# Patient Record
Sex: Female | Born: 1945 | Race: White | Hispanic: No | Marital: Married | Smoking: Never smoker
Health system: Southern US, Community
[De-identification: ages and names within clinical notes are randomized; demographics above are authoritative.]

## PROBLEM LIST (undated history)

## (undated) DIAGNOSIS — I10 Essential (primary) hypertension: Secondary | ICD-10-CM

## (undated) HISTORY — PX: RECTOCELE REPAIR: SHX761

## (undated) HISTORY — PX: EYE SURGERY: SHX253

## (undated) HISTORY — PX: TONSILLECTOMY: SUR1361

## (undated) HISTORY — PX: CHOLECYSTECTOMY: SHX55

---

## 2007-12-23 ENCOUNTER — Ambulatory Visit: Payer: Self-pay | Admitting: Internal Medicine

## 2010-11-28 ENCOUNTER — Ambulatory Visit: Payer: Self-pay | Admitting: Internal Medicine

## 2011-12-06 DIAGNOSIS — H9319 Tinnitus, unspecified ear: Secondary | ICD-10-CM | POA: Insufficient documentation

## 2011-12-06 DIAGNOSIS — I10 Essential (primary) hypertension: Secondary | ICD-10-CM | POA: Insufficient documentation

## 2012-01-17 DIAGNOSIS — M542 Cervicalgia: Secondary | ICD-10-CM | POA: Insufficient documentation

## 2012-01-17 DIAGNOSIS — G5700 Lesion of sciatic nerve, unspecified lower limb: Secondary | ICD-10-CM | POA: Insufficient documentation

## 2015-09-30 DIAGNOSIS — R202 Paresthesia of skin: Secondary | ICD-10-CM | POA: Insufficient documentation

## 2016-04-14 DIAGNOSIS — M5010 Cervical disc disorder with radiculopathy, unspecified cervical region: Secondary | ICD-10-CM | POA: Insufficient documentation

## 2017-08-11 ENCOUNTER — Ambulatory Visit (INDEPENDENT_AMBULATORY_CARE_PROVIDER_SITE_OTHER): Payer: 59

## 2017-08-11 ENCOUNTER — Ambulatory Visit (INDEPENDENT_AMBULATORY_CARE_PROVIDER_SITE_OTHER): Payer: 59 | Admitting: Podiatry

## 2017-08-11 ENCOUNTER — Other Ambulatory Visit: Payer: Self-pay | Admitting: Podiatry

## 2017-08-11 DIAGNOSIS — M545 Low back pain: Secondary | ICD-10-CM

## 2017-08-11 DIAGNOSIS — M21619 Bunion of unspecified foot: Secondary | ICD-10-CM

## 2017-08-11 DIAGNOSIS — I471 Supraventricular tachycardia: Secondary | ICD-10-CM | POA: Insufficient documentation

## 2017-08-11 DIAGNOSIS — M8580 Other specified disorders of bone density and structure, unspecified site: Secondary | ICD-10-CM | POA: Insufficient documentation

## 2017-08-11 DIAGNOSIS — G8929 Other chronic pain: Secondary | ICD-10-CM | POA: Insufficient documentation

## 2017-08-11 DIAGNOSIS — E559 Vitamin D deficiency, unspecified: Secondary | ICD-10-CM | POA: Insufficient documentation

## 2017-08-11 DIAGNOSIS — G629 Polyneuropathy, unspecified: Secondary | ICD-10-CM | POA: Insufficient documentation

## 2017-08-11 DIAGNOSIS — E785 Hyperlipidemia, unspecified: Secondary | ICD-10-CM | POA: Insufficient documentation

## 2017-08-11 DIAGNOSIS — N281 Cyst of kidney, acquired: Secondary | ICD-10-CM | POA: Insufficient documentation

## 2017-08-11 DIAGNOSIS — M722 Plantar fascial fibromatosis: Secondary | ICD-10-CM

## 2017-08-11 DIAGNOSIS — E669 Obesity, unspecified: Secondary | ICD-10-CM | POA: Insufficient documentation

## 2017-08-14 NOTE — Progress Notes (Signed)
   Subjective: 72 year old female presenting today as a new patient with a chief complaint of a bunion to the medial side of the right foot. She states her great toe is beginning to shift underneath the 2nd toe. She reports having a left bunionectomy by Dr. Charlsie Merlesegal 10 years ago. Patient is here for further evaluation and treatment.   No past medical history on file.    Objective: Physical Exam General: The patient is alert and oriented x3 in no acute distress.  Dermatology: Skin is cool, dry and supple bilateral lower extremities. Negative for open lesions or macerations.  Vascular: Palpable pedal pulses bilaterally. No edema or erythema noted. Capillary refill within normal limits.  Neurological: Epicritic and protective threshold grossly intact bilaterally.   Musculoskeletal Exam: Clinical evidence of bunion deformity noted to the respective foot. There is a moderate pain on palpation range of motion of the first MPJ. Lateral deviation of the hallux noted consistent with hallux abductovalgus.  Radiographic Exam: Increased intermetatarsal angle greater than 15 with a hallux abductus angle greater than 30 noted on AP view. Moderate degenerative changes noted within the first MPJ.  Assessment: 1. HAV w/ bunion deformity right foot   Plan of Care:  1. Patient was evaluated. X-Rays reviewed. 2. Today we discussed the conservative versus surgical management of the presenting pathology. The patient opts for surgical management. All possible complications and details of the procedure were explained. All patient questions were answered. No guarantees were expressed or implied. 3. Authorization for surgery was initiated today. Surgery will consist of bunionectomy with osteotomy right. 4. CAM boot dispensed. 5. Return to clinic one week postop.     Felecia ShellingBrent M. Evans, DPM [facility]  Dr. Felecia ShellingBrent M. Evans, DPM    [address]                                          [place], [place] [address]                Office [phone]  Fax [phone]

## 2017-08-23 ENCOUNTER — Telehealth: Payer: Self-pay | Admitting: Podiatry

## 2017-08-23 ENCOUNTER — Other Ambulatory Visit: Payer: Self-pay | Admitting: Podiatry

## 2017-08-23 ENCOUNTER — Telehealth: Payer: Self-pay | Admitting: *Deleted

## 2017-08-23 MED ORDER — OXYCODONE-ACETAMINOPHEN 10-325 MG PO TABS: 1.0000 | ORAL_TABLET | Freq: Three times a day (TID) | ORAL | 0 refills | Status: DC | PRN

## 2017-08-23 MED ORDER — ONDANSETRON HCL 4 MG PO TABS: 4.0000 mg | ORAL_TABLET | Freq: Three times a day (TID) | ORAL | 0 refills | Status: DC | PRN

## 2017-08-23 NOTE — Telephone Encounter (Signed)
Called pt to let her know that we got authorization from [employer] for her insurance tomorrow. Dr. Pernell DupreAdams wanted to know if there was a reference or authorization number to which I gave her. Pt then stated she had some questions for the nurse. I transferred her to speak with our nurse Joya SanValery.

## 2017-08-23 NOTE — Telephone Encounter (Signed)
Pt states she has post op nausea and vomiting and requires 4mg  Zofran IV. Pt states her surgery is after 1:00pm tomorrow and she will have been without food for over 17 hours, if she eats at 8:00pm and goes to bed at 9:00pm and she feels that is too long to fast and would like to be able to eat or have clear liquid 8 hours before surgery. Pt states she would like to have the [facility] medication [facility] today.

## 2017-08-23 NOTE — Telephone Encounter (Signed)
Hello, this is Dr. Reginold AgentLaurel Perrott calling. I'm scheduled for surgery tomorrow with Dr. Logan BoresEvans. I'm calling to confirm that my insurance [employer] has provided the pre-approval. If I do not receive this pre-approval or I do not hear back from you I will have to cancel my surgery for tomorrow. So please call me back at your earliest convenience at [phone]. Thank you.

## 2017-08-23 NOTE — Progress Notes (Signed)
Patient scheduled for bunionectomy surgery tomorrow 08/24/2017.  Patient called in today stating that she does experience post surgical nausea.  She requests a prescription for Zofran 4 mg as needed nausea and vomiting postoperatively.  Patient also requests that she receives IV Zofran 4 mg postop.  Patient also requesting a prescription for pain medication sent to the pharmacy today for surgery tomorrow. Prescription for Zofran 4 mg #30 as well as Percocet 10/325 mg #30 sent to patient's pharmacy today.  Felecia ShellingBrent M. Khiya Friese, DPM [facility]  Dr. Felecia ShellingBrent M. Melitta Tigue, DPM    [address]                                    [place], [place] [address]                Office [phone]  Fax [phone]

## 2017-08-23 NOTE — Telephone Encounter (Signed)
Prescriptions sent to pharmacy

## 2017-08-23 NOTE — Telephone Encounter (Signed)
I informed pt, that Dr. Logan BoresEvans stated he would send the pain [id number], I had spoken with Judie GrieveBryan, RN - [facility] and he would discuss the IV Zofran with Anesthesiology, and the latest she could eat was 6:00am light dry toast, clear liquid without dairy or grease. Pt states understanding.

## 2017-08-23 NOTE — Telephone Encounter (Signed)
Left message for Katie - [facility] Pre-op Nurse to call with information concerning the NPO latest for 1:00pm pt, and IV Zofran.

## 2017-08-23 NOTE — Telephone Encounter (Signed)
I informed Judie GrieveBryan, RN - [facility] Surgical Procedure Head Nurse of pt's requirement for Zofran prior to procedure and request for lastest she could eat prior to surgery. Judie GrieveBryan, RN - states he will speak with anesthesiology concerning the IV Zofran, pt can have light food like dry toast and clear liquid, no dairy or grease prior to 6:00am 08/24/2017.

## 2017-09-01 ENCOUNTER — Encounter: Payer: 59 | Admitting: Podiatry

## 2017-09-05 NOTE — Progress Notes (Signed)
This encounter was created in error - please disregard.

## 2017-09-08 ENCOUNTER — Encounter: Payer: 59 | Admitting: Podiatry

## 2017-09-12 NOTE — Progress Notes (Signed)
This encounter was created in error - please disregard.

## 2017-10-02 NOTE — Progress Notes (Signed)
DOS:08-24-2017 Bunionectomy with osteotomy right foot

## 2021-03-10 ENCOUNTER — Other Ambulatory Visit: Payer: Self-pay | Admitting: Family Medicine

## 2021-03-10 DIAGNOSIS — Z1231 Encounter for screening mammogram for malignant neoplasm of breast: Secondary | ICD-10-CM

## 2021-03-10 DIAGNOSIS — Z780 Asymptomatic menopausal state: Secondary | ICD-10-CM

## 2021-04-06 ENCOUNTER — Other Ambulatory Visit: Payer: Self-pay

## 2021-04-06 ENCOUNTER — Ambulatory Visit
Admission: RE | Admit: 2021-04-06 | Discharge: 2021-04-06 | Disposition: A | Discharge status: Home / Self Care | Payer: Medicare Other | Source: Ambulatory Visit | Attending: Family Medicine | Admitting: Family Medicine

## 2021-04-06 DIAGNOSIS — Z780 Asymptomatic menopausal state: Secondary | ICD-10-CM | POA: Diagnosis not present

## 2021-04-06 DIAGNOSIS — Z1231 Encounter for screening mammogram for malignant neoplasm of breast: Secondary | ICD-10-CM

## 2021-04-06 DIAGNOSIS — M85852 Other specified disorders of bone density and structure, left thigh: Secondary | ICD-10-CM | POA: Insufficient documentation

## 2021-04-06 DIAGNOSIS — Z13820 Encounter for screening for osteoporosis: Secondary | ICD-10-CM | POA: Diagnosis present

## 2021-05-01 LAB — EXTERNAL GENERIC LAB PROCEDURE: COLOGUARD: NEGATIVE

## 2021-05-01 LAB — COLOGUARD: COLOGUARD: NEGATIVE

## 2022-03-28 IMAGING — MG MM DIGITAL SCREENING BILAT W/ TOMO AND CAD
6 of 10 series · 6 of 30 positions shown · non-contrast
Comparison: None.

CLINICAL DATA: Screening.

EXAM:
DIGITAL SCREENING BILATERAL MAMMOGRAM WITH TOMOSYNTHESIS AND CAD
TECHNIQUE: Bilateral screening digital craniocaudal and mediolateral oblique
mammograms were obtained. Bilateral screening digital breast
tomosynthesis was performed. The images were evaluated with
computer-aided detection.

[R MLO synth-2D (1 of 2)]
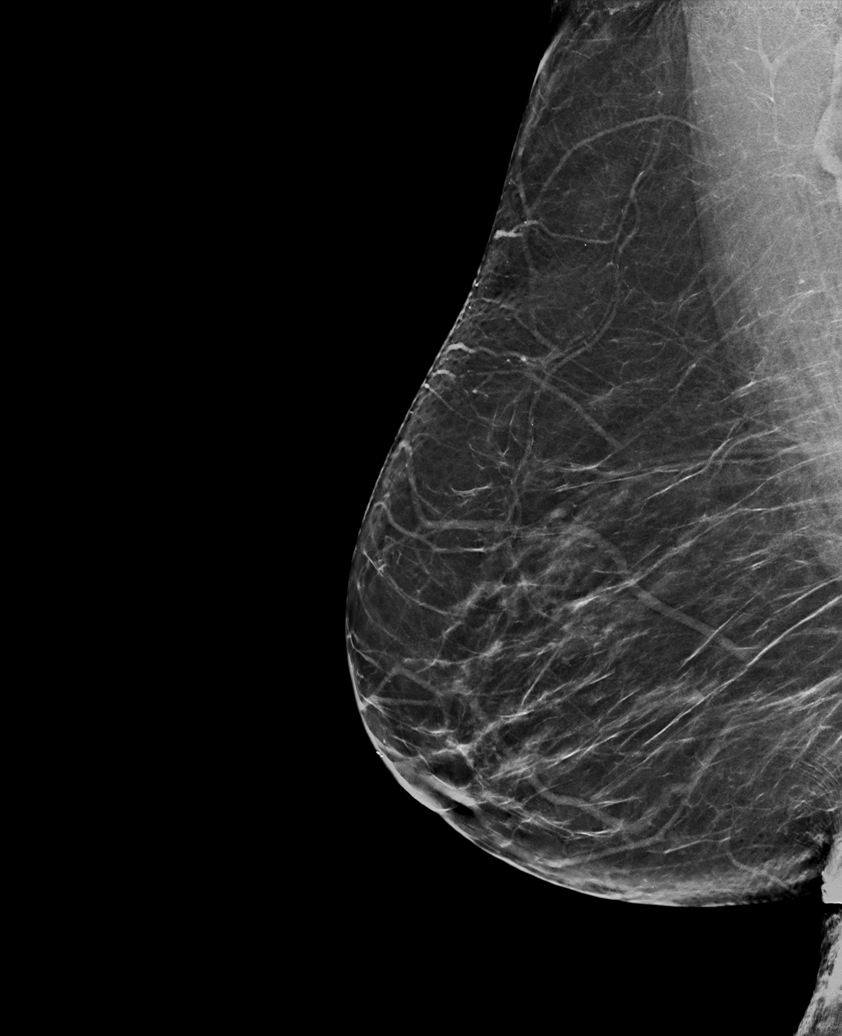

[R CC synth-2D]
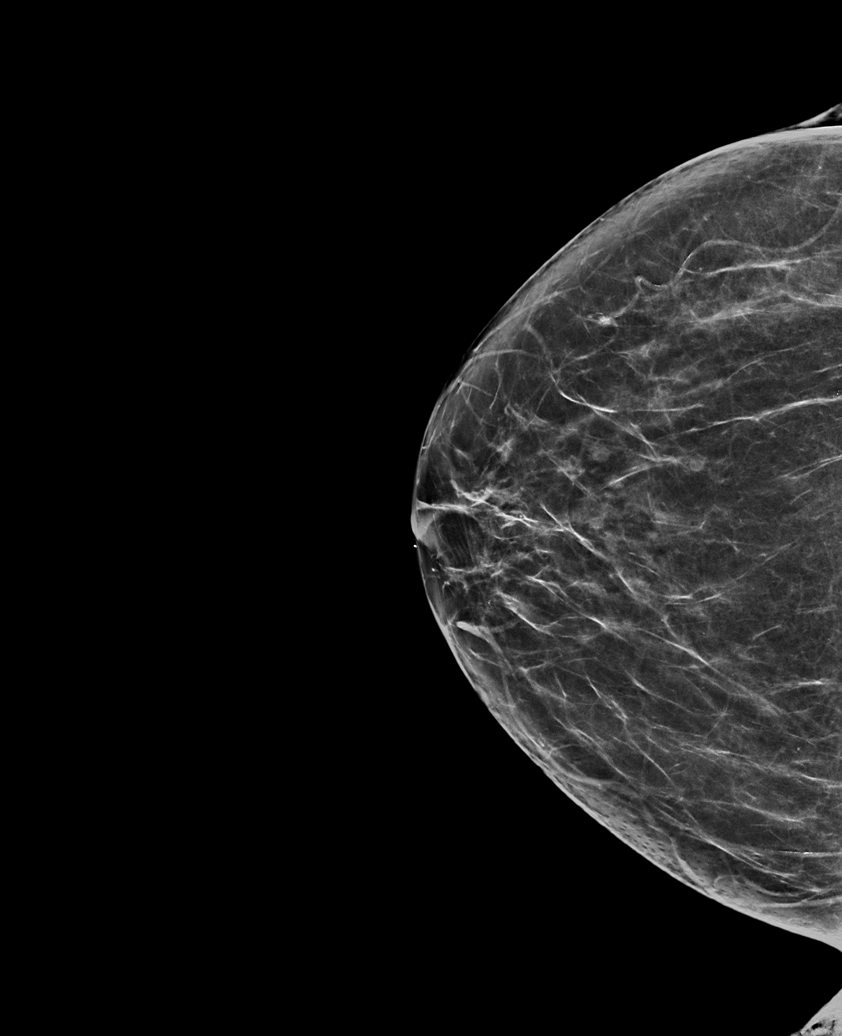

[L CC synth-2D]
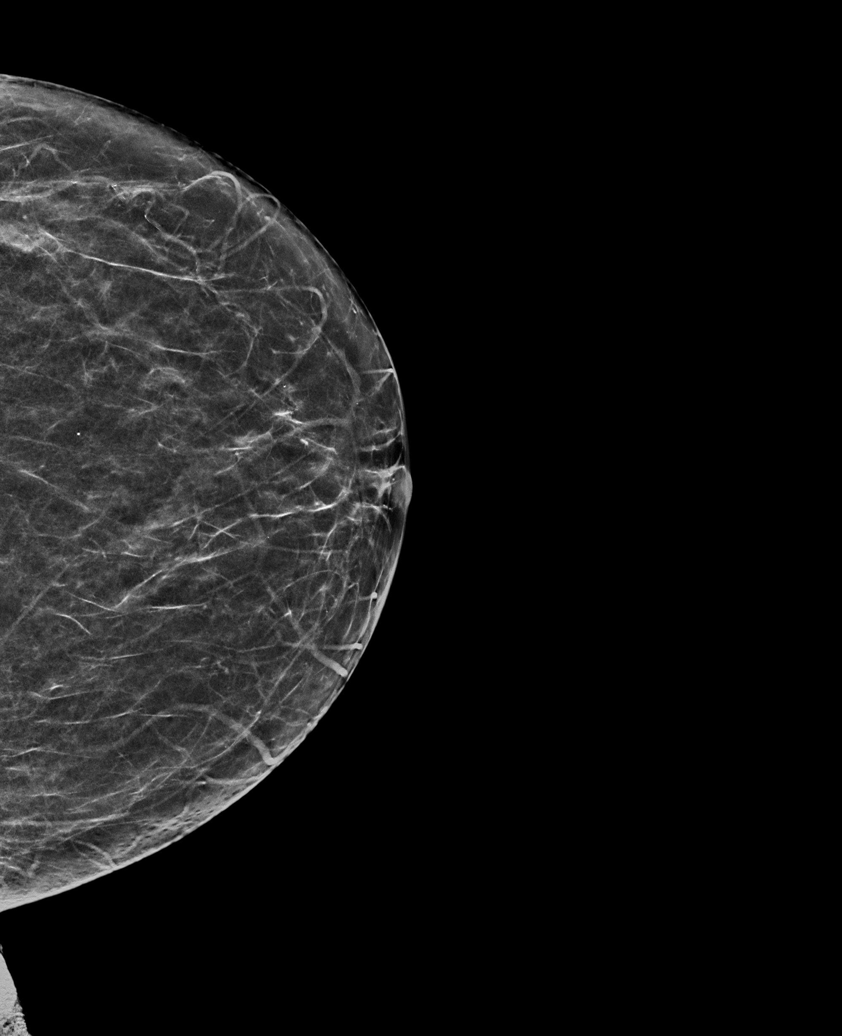

[L MLO synth-2D]
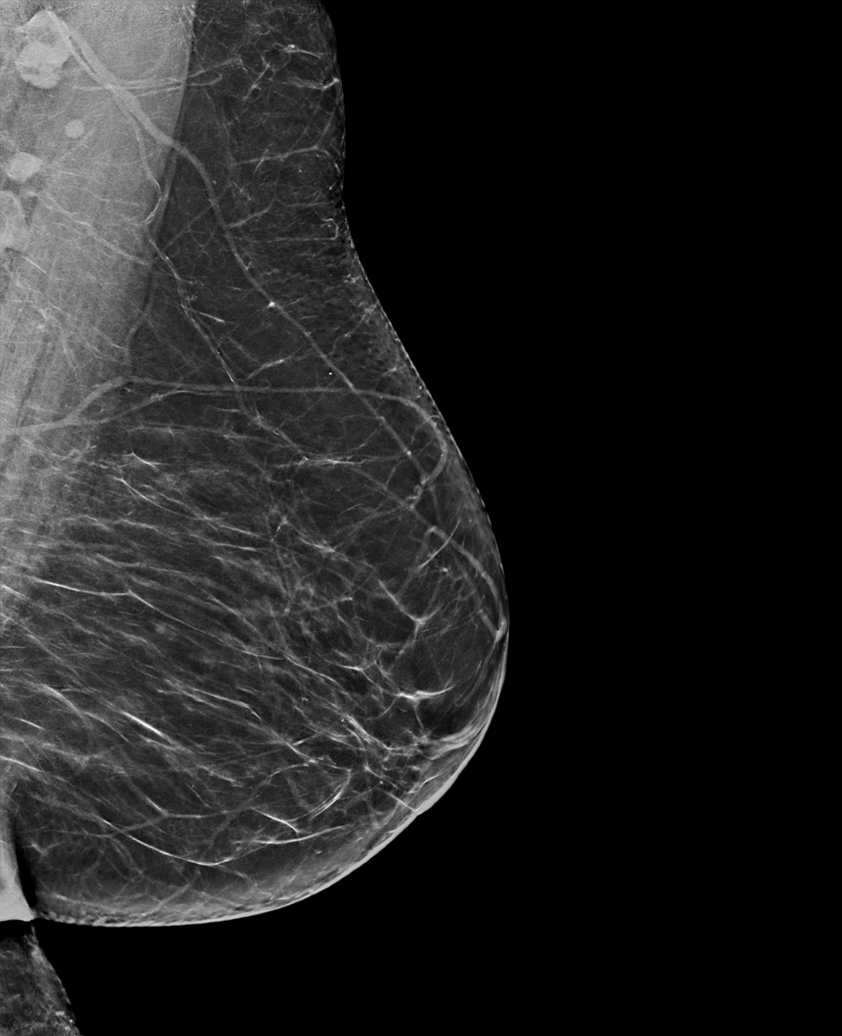

[R MLO synth-2D (2 of 2)]
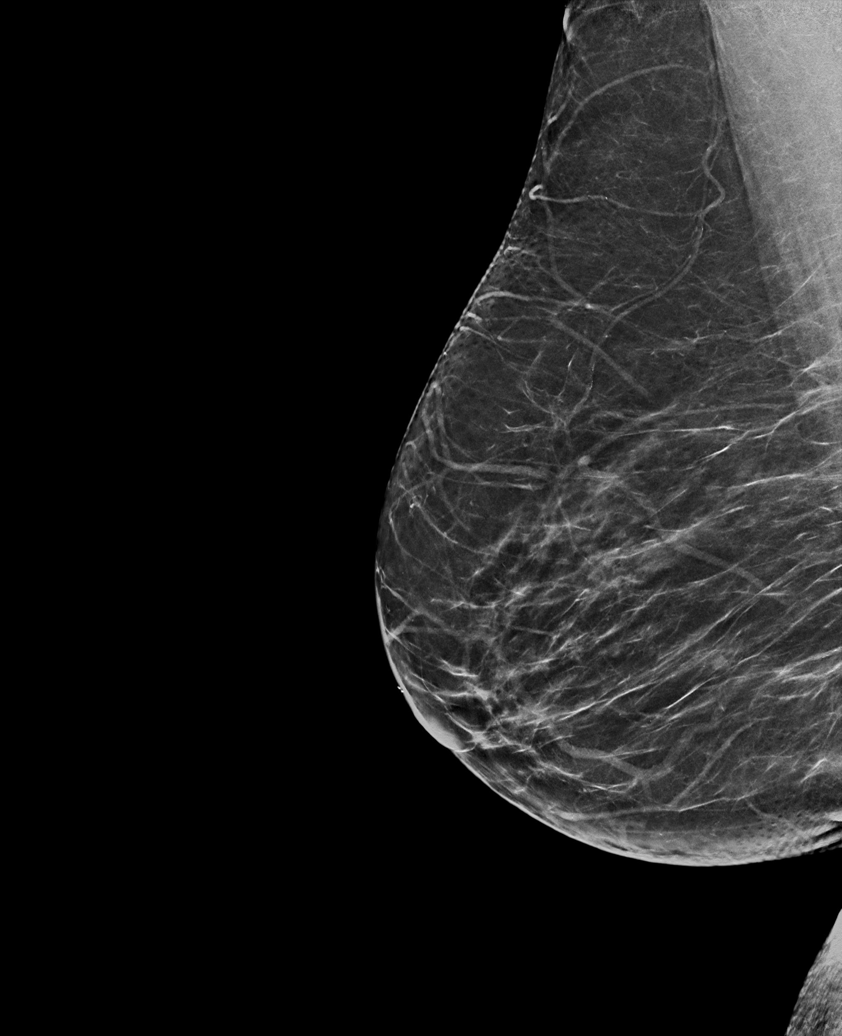

[L CC tomo · tomo slice 30/59.0]
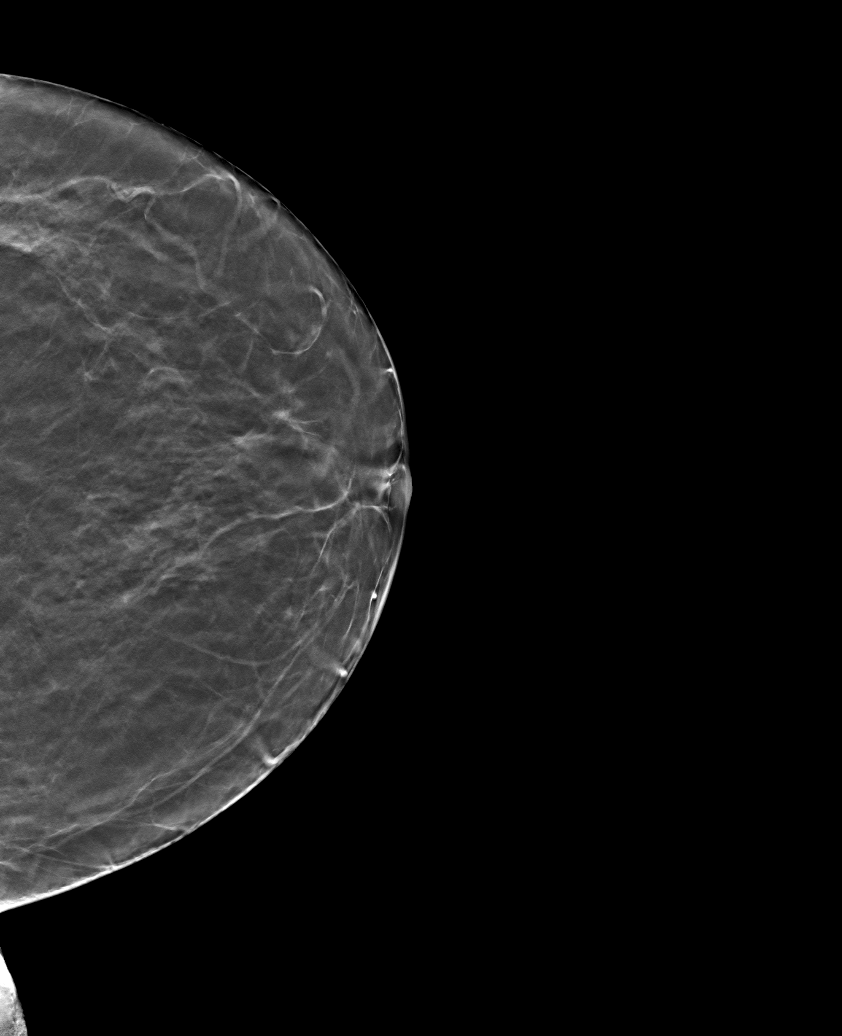

[6 of 30 positions shown; findings below may reference images not displayed]

ACR Breast Density Category b: There are scattered areas of
fibroglandular density.
FINDINGS: There are no findings suspicious for malignancy.
IMPRESSION: No mammographic evidence of malignancy. A result letter of this
screening mammogram will be mailed directly to the patient.

RECOMMENDATION:
Screening mammogram in one year. (Code:[id number]-[id number])

BI-RADS CATEGORY  1: Negative.

## 2022-06-07 ENCOUNTER — Ambulatory Visit (LOCAL_COMMUNITY_HEALTH_CENTER): Payer: Medicare Other

## 2022-06-07 DIAGNOSIS — Z719 Counseling, unspecified: Secondary | ICD-10-CM

## 2022-06-07 DIAGNOSIS — Z23 Encounter for immunization: Secondary | ICD-10-CM

## 2022-06-07 NOTE — Progress Notes (Signed)
  Are you feeling sick today? No   Have you ever received a dose of COVID-19 Vaccine? (Pfizer, Janssen, Moderna, Novavax, Other) Yes  If yes, which vaccine and how many doses?   5 doses Pfizer   Did you bring the vaccination record card or other documentation?  Yes   Do you have a health condition or are undergoing treatment that makes you moderately or severely immunocompromised? This would include, but not be limited to: cancer, HIV, organ transplant, immunosuppressive therapy/high-dose corticosteroids, or moderate/severe primary immunodeficiency.  No  Have you received COVID-19 vaccine before or during hematopoietic cell transplant (HCT) or CAR-T-cell therapies? No  Have you ever had an allergic reaction to: (This would include a severe allergic reaction or a reaction that caused hives, swelling, or respiratory distress, including wheezing.) A component of a COVID-19 vaccine or a previous dose of COVID-19 vaccine? No   Have you ever had an allergic reaction to another vaccine (other thanCOVID-19 vaccine) or an injectable medication? (This would include a severe allergic reaction or a reaction that caused hives, swelling, or respiratory distress, including wheezing.)   No    Do you have a history of any of the following:  Myocarditis or Pericarditis No  Dermal fillers:  No  Multisystem Inflammatory Syndrome (MIS-C or MIS-A)? No  COVID-19 disease within the past 3 months? No  Vaccinated with monkeypox vaccine in the last 4 weeks? No  VIS provided.  IM in left deltoid.  Tolerated well.  COVID card updated and [facility] updated and copy provided. Waited 15 minutes.   

## 2023-08-30 ENCOUNTER — Ambulatory Visit (INDEPENDENT_AMBULATORY_CARE_PROVIDER_SITE_OTHER): Payer: Medicare Other

## 2023-08-30 ENCOUNTER — Encounter: Payer: Self-pay | Admitting: Emergency Medicine

## 2023-08-30 ENCOUNTER — Ambulatory Visit
Admission: EM | Admit: 2023-08-30 | Discharge: 2023-08-30 | Disposition: A | Discharge status: Home / Self Care | Payer: Medicare Other

## 2023-08-30 DIAGNOSIS — M25511 Pain in right shoulder: Secondary | ICD-10-CM

## 2023-08-30 DIAGNOSIS — M79601 Pain in right arm: Secondary | ICD-10-CM | POA: Diagnosis not present

## 2023-08-30 DIAGNOSIS — S46001A Unspecified injury of muscle(s) and tendon(s) of the rotator cuff of right shoulder, initial encounter: Secondary | ICD-10-CM | POA: Diagnosis not present

## 2023-08-30 DIAGNOSIS — G8911 Acute pain due to trauma: Secondary | ICD-10-CM

## 2023-08-30 DIAGNOSIS — S42141A Displaced fracture of glenoid cavity of scapula, right shoulder, initial encounter for closed fracture: Secondary | ICD-10-CM | POA: Diagnosis not present

## 2023-08-30 DIAGNOSIS — S42151A Displaced fracture of neck of scapula, right shoulder, initial encounter for closed fracture: Secondary | ICD-10-CM

## 2023-08-30 HISTORY — DX: Essential (primary) hypertension: I10

## 2023-08-30 NOTE — Discharge Instructions (Addendum)
-  There is a slight fracture of the shoulder and also possibly a rotator cuff injury -Continue regular home oxycodone  as needed for pain relief and avoid use of the extremity until you are seen by orthopedics. - Wear the sling to keep you from using your arm.  You have a condition requiring you to follow up with Orthopedics so please call one of the following office for appointment:   [facility] Address: [address], [place], [place] [address] Phone: [phone]  [facility] [address] [place], [place] [address] Phone: [phone]  [facility] [address], [place], [place] [address] Phone: [phone]

## 2023-08-30 NOTE — ED Provider Notes (Signed)
 [facility]    CSN: [id number] Arrival date & time: 08/30/23  9077      History   Chief Complaint Chief Complaint  Patient presents with   Fall    HPI Pamela Dickson is a 78 y.o. female presenting for pain of the right shoulder, clavicle and upper arm for the past 9 days.  Patient reports tripping over a rug in her house and falling onto her right side.  Reports very limited range of motion of the shoulder.  No weakness or numbness.  Has been taking home oxycodone  for pain relief.  Takes this for chronic back pain.  Denies any other injuries or concerns.  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      Home Medications    Prior to Admission medications   Medication Sig Start Date End Date Taking? Authorizing Provider  lidocaine-prilocaine (EMLA) cream Apply topically once. 05/25/23  Yes [provider]  losartan (COZAAR) 100 MG tablet Take 1 tablet by mouth at bedtime. 07/11/23 07/10/24 Yes [provider]  gabapentin (NEURONTIN) 100 MG capsule START AT 1 CAPSULE AT BEDTIME, MAY INCREASE UP TO 4 CAPSULES NIGHTLY *MAY ALSO ADD 1 CAP IN DAYTIME 07/13/17   [provider]  OXYCONTIN  20 MG 12 hr  tablet TAKE 1 TABLET (20 MG TOTAL) BY MOUTH EVERY 12 (TWELVE) HOURS. DNF 07/07/2017 07/13/17   [provider]    Family History Family History  Problem Relation Age of Onset   Breast cancer Neg Hx     Social History Social History   Tobacco Use   Smoking status: Never   Smokeless tobacco: Never  Vaping Use   Vaping status: Never Used  Substance Use Topics   Alcohol use: Yes    Comment: social   Drug use: Never     Allergies   Latex, Iodides, and Penicillins   Review of Systems Review of Systems  Musculoskeletal:  Positive for arthralgias. Negative for joint swelling and neck pain.  Skin:  Negative for color change and wound.  Neurological:  Negative for weakness and numbness.     Physical Exam Triage Vital Signs ED Triage Vitals  Encounter Vitals Group     BP 08/30/23 1026 136/74     Systolic BP Percentile --      Diastolic BP Percentile --      Pulse Rate 08/30/23 1026 76     Resp 08/30/23 1026 18     Temp 08/30/23 1026 97.8 F (36.6 C)     Temp Source 08/30/23 1026 Oral     SpO2 08/30/23 1026 100 %     Weight --      Height --  Head Circumference --      Peak Flow --      Pain Score 08/30/23 1023 9     Pain Loc --      Pain Education --      Exclude from Growth Chart --    No data found.  Updated Vital Signs BP 136/74 (BP Location: Left Arm)   Pulse 76   Temp 97.8 F (36.6 C) (Oral)   Resp 18   SpO2 100%      Physical Exam Vitals and nursing note reviewed.  Constitutional:      General: She is not in acute distress.    Appearance: Normal appearance. She is not ill-appearing or toxic-appearing.  HENT:     Head: Normocephalic and atraumatic.  Eyes:     General: No scleral icterus.       Right eye: No discharge.        Left eye: No discharge.     Conjunctiva/sclera: Conjunctivae normal.  Cardiovascular:     Rate and Rhythm: Normal rate and regular rhythm.     Heart sounds: Normal heart sounds.  Pulmonary:     Effort:  Pulmonary effort is normal. No respiratory distress.     Breath sounds: Normal breath sounds.  Musculoskeletal:     Right shoulder: Tenderness (Proximal humerus, lateral upper arm, entire clavicle especially most proximal aspect) present. No deformity. Decreased range of motion (very limited in all directions). Normal strength. Normal pulse.     Cervical back: Neck supple.  Skin:    General: Skin is dry.  Neurological:     General: No focal deficit present.     Mental Status: She is alert. Mental status is at baseline.     Motor: No weakness.     Gait: Gait normal.  Psychiatric:        Mood and Affect: Mood normal.        Behavior: Behavior normal.      UC Treatments / Results  Labs (all labs ordered are listed, but only abnormal results are displayed) Labs Reviewed - No data to display  EKG   Radiology DG Shoulder Right Addendum Date: 08/30/2023 ADDENDUM REPORT: 08/30/2023 12:03 ADDENDUM: Correlation with right clavicle and humerus images today. On image #4 of this exam there is subtle bony discontinuity along the inferior right glenoid. CONCLUSION: A subtle minimally displaced fracture of the inferior right glenoid articular surface is suspected. Electronically Signed   By: VEAR Hurst M.D.   On: 08/30/2023 12:03   Result Date: 08/30/2023 CLINICAL DATA:  79 year old female status post fall 9 days ago with continued pain. EXAM: RIGHT SHOULDER - 2+ VIEW COMPARISON:  None Available. FINDINGS: No glenohumeral joint dislocation. Bone mineralization is within normal limits for age. Proximal right humerus appears intact. Os acromiale normal variant suspected. No right clavicle or scapula fracture is identified. Grossly negative visible right ribs and chest. IMPRESSION: 1. No acute fracture or dislocation identified about the right shoulder. 2. Os acromiale variant suspected. Electronically Signed: By: VEAR Hurst M.D. On: 08/30/2023 11:58   DG Clavicle Right Result Date: 08/30/2023 CLINICAL DATA:   78 year old female status post fall 9 days ago with continued pain. EXAM: RIGHT CLAVICLE - 2+ VIEWS COMPARISON:  Right humerus and shoulder series today. FINDINGS: Bone mineralization is within normal limits. On these images the right clavicle appears intact and aligned but there is subtle discontinuity of the surface of the right scapula glenoid on both images. Proximal right humerus appears to remain intact.  Negative visible right lung apex. IMPRESSION: 1. Evidence of minimally displaced right glenoid articular surface fracture of the scapula. 2. Right clavicle appears intact and aligned. Electronically Signed   By: VEAR Hurst M.D.   On: 08/30/2023 12:01   DG Humerus Right Result Date: 08/30/2023 CLINICAL DATA:  78 year old female status post fall 9 days ago with continued pain. EXAM: RIGHT HUMERUS - 2+ VIEW COMPARISON:  Right shoulder series today. FINDINGS: Bone mineralization is within normal limits for age. Alignment appears maintained at both the right shoulder and elbow. Right humerus appears intact. No acute osseous abnormality identified. Soft tissue contours are within normal limits. IMPRESSION: No acute fracture or dislocation identified about the right humerus. Electronically Signed   By: VEAR Hurst M.D.   On: 08/30/2023 11:59    Procedures Procedures (including critical care time)  Medications Ordered in UC Medications - No data to display  Initial Impression / Assessment and Plan / UC Course  I have reviewed the triage vital signs and the nursing notes.  Pertinent labs & imaging results that were available during my care of the patient were reviewed by me and considered in my medical decision making (see chart for details).   78 year old female presents for right shoulder, clavicle and upper arm pain after falling onto the right arm 9 days ago.  Has been taking home oxycodone  for pain relief.  X-ray of right shoulder, humerus and clavicle obtained today.  Concern for fracture of proximal  humerus on wet read.  Over read of x-rays shows minimally displaced right glenoid articular surface fracture of the scapula.  Reviewed this with patient.  Patient placed in sling.  Reviewed supportive care.  Advised avoiding painful activities and to avoid use of arm until cleared by orthopedics.  Advised that she will need to follow-up with orthopedics for further evaluation and likely MRI to also rule out any associated rotator cuff tears.  Patient given information for orthopedic follow-up.  Patient is understanding and agreeable.  Advised to continue home oxycodone  as needed for severe pain.  Return here as needed.   Final Clinical Impressions(s) / UC Diagnoses   Final diagnoses:  Right arm pain  Acute pain of right shoulder due to trauma  Injury of right rotator cuff, initial encounter  Closed fracture of glenoid cavity and neck of right scapula, initial encounter     Discharge Instructions      -There is a slight fracture of the shoulder and also possibly a rotator cuff injury -Continue regular home oxycodone  as needed for pain relief and avoid use of the extremity until you are seen by orthopedics. - Wear the sling to keep you from using your arm.  You have a condition requiring you to follow up with Orthopedics so please call one of the following office for appointment:   [facility] Address: [address] [place], [place] [address] Phone: [phone]  [facility] [address], [place], [place] [address] Phone: [phone]  [facility] [address] [place], [place] [address] Phone: [phone]      ED Prescriptions   None    PDMP not reviewed this encounter.   Arvis Jolan NOVAK, PA-C 08/30/23 1254

## 2023-08-30 NOTE — ED Triage Notes (Signed)
 Patient states she fell and tripped on a rug at home nine days ago. Patient c/o right shoulder discomfort. Patient states she is using tylenol  for the discomfort.

## 2023-10-09 ENCOUNTER — Other Ambulatory Visit: Payer: Self-pay | Admitting: Orthopedic Surgery

## 2023-10-09 DIAGNOSIS — S42144D Nondisplaced fracture of glenoid cavity of scapula, right shoulder, subsequent encounter for fracture with routine healing: Secondary | ICD-10-CM

## 2023-10-10 ENCOUNTER — Ambulatory Visit
Admission: RE | Admit: 2023-10-10 | Discharge: 2023-10-10 | Disposition: A | Discharge status: Home / Self Care | Source: Ambulatory Visit | Attending: Orthopedic Surgery | Admitting: Orthopedic Surgery

## 2023-10-10 DIAGNOSIS — S42144D Nondisplaced fracture of glenoid cavity of scapula, right shoulder, subsequent encounter for fracture with routine healing: Secondary | ICD-10-CM
